# Patient Record
Sex: Female | Born: 1990 | Race: Black or African American | Hispanic: No | Marital: Single | Smoking: Never smoker
Health system: Southern US, Community
[De-identification: ages and names within clinical notes are randomized; demographics above are authoritative.]

## PROBLEM LIST (undated history)

## (undated) DIAGNOSIS — T7840XA Allergy, unspecified, initial encounter: Secondary | ICD-10-CM

## (undated) HISTORY — DX: Allergy, unspecified, initial encounter: T78.40XA

---
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
## 2011-01-15 ENCOUNTER — Telehealth (INDEPENDENT_AMBULATORY_CARE_PROVIDER_SITE_OTHER): Payer: Self-pay | Admitting: *Deleted

## 2011-01-25 NOTE — Progress Notes (Signed)
Summary: Sprintec refill  Phone Note Refill Request Message from:  Fax from Pharmacy on January 15, 2011 11:10 AM  Refills Requested: Medication #1:  SPRINTEC 28 0.25-35 MG-MCG TABS take 1 tab daily as directed   Notes: qty = [address] [facility],  [address] [place], [place], [place]  phone - [phone], fax - [phone]      Next Appointment Scheduled: none Initial call taken by: Jerolyn Shin,  January 15, 2011 11:16 AM    Prescriptions: SPRINTEC 28 0.25-35 MG-MCG TABS (NORGESTIMATE-ETH ESTRADIOL) take 1 tab daily as directed  #1 pack x 3   Entered by:   Jeremy Johann CMA   Authorized by:   Neena Rhymes MD   Signed by:   Jeremy Johann CMA on 01/15/2011   Method used:   Printed then faxed to ...         RxID:   [id number]

## 2011-04-06 ENCOUNTER — Other Ambulatory Visit: Payer: Self-pay | Admitting: Family Medicine

## 2011-04-06 NOTE — Telephone Encounter (Signed)
Sent one refill, pt due for CPX. 

## 2011-05-08 ENCOUNTER — Encounter: Payer: Self-pay | Admitting: Family Medicine

## 2011-05-09 ENCOUNTER — Encounter: Payer: Self-pay | Admitting: Family Medicine

## 2011-05-09 ENCOUNTER — Ambulatory Visit (INDEPENDENT_AMBULATORY_CARE_PROVIDER_SITE_OTHER): Payer: BC Managed Care – PPO | Admitting: Family Medicine

## 2011-05-09 DIAGNOSIS — Z0000 Encounter for general adult medical examination without abnormal findings: Secondary | ICD-10-CM

## 2011-05-09 MED ORDER — NORGESTIMATE-ETH ESTRADIOL 0.25-35 MG-MCG PO TABS: 1.0000 | ORAL_TABLET | Freq: Every day | ORAL | Status: DC

## 2011-05-09 NOTE — Progress Notes (Signed)
  Subjective:    Patient ID: Wendy Hutchinson, female    DOB: 01-14-1991, 20 y.o.   MRN: [id number]  HPI CPE- no concerns today, became sexually active at age 18- no need for pap until age 36.   Review of Systems Patient reports no vision/ hearing changes, adenopathy,fever, weight change,  persistant/recurrent hoarseness , swallowing issues, chest pain, palpitations, edema, persistant/recurrent cough, hemoptysis, dyspnea (rest/exertional/paroxysmal nocturnal), gastrointestinal bleeding (melena, rectal bleeding), abdominal pain, significant heartburn, bowel changes, GU symptoms (dysuria, hematuria, incontinence), Gyn symptoms (abnormal  bleeding, pain),  syncope, focal weakness, memory loss, numbness & tingling, skin/hair/nail changes, abnormal bruising or bleeding, anxiety, or depression.     Objective:   Physical Exam  General Appearance:    Alert, cooperative, no distress, appears stated age  Head:    Normocephalic, without obvious abnormality, atraumatic  Eyes:    PERRL, conjunctiva/corneas clear, EOM's intact, fundi    benign, both eyes  Ears:    Normal TM's and external ear canals, both ears  Nose:   Nares normal, septum midline, mucosa normal, no drainage    or sinus tenderness  Throat:   Lips, mucosa, and tongue normal; teeth and gums normal  Neck:   Supple, symmetrical, trachea midline, no adenopathy;    Thyroid: no enlargement/tenderness/nodules  Back:     Symmetric, no curvature, ROM normal, no CVA tenderness  Lungs:     Clear to auscultation bilaterally, respirations unlabored  Chest Wall:    No tenderness or deformity   Heart:    Regular rate and rhythm, S1 and S2 normal, no murmur, rub   or gallop  Breast Exam:    No tenderness, masses, or nipple abnormality  Abdomen:     Soft, non-tender, bowel sounds active all four quadrants,    no masses, no organomegaly  Genitalia:    Deferred  Rectal:    Deferred  Extremities:   Extremities normal, atraumatic, no cyanosis or edema   Pulses:   2+ and symmetric all extremities  Skin:   Skin color, texture, turgor normal, no rashes or lesions  Lymph nodes:   Cervical, supraclavicular, and axillary nodes normal  Neurologic:   CNII-XII intact, normal strength, sensation and reflexes    throughout          Assessment & Plan:

## 2011-05-09 NOTE — Patient Instructions (Signed)
Your exam looks great!  Keep up the good work! Call if you're having trouble getting your pills Have a great summer!

## 2011-05-13 ENCOUNTER — Encounter: Payer: Self-pay | Admitting: Family Medicine

## 2011-05-13 DIAGNOSIS — Z0000 Encounter for general adult medical examination without abnormal findings: Secondary | ICD-10-CM | POA: Insufficient documentation

## 2011-05-13 NOTE — Assessment & Plan Note (Signed)
Pt's PE WNL.  Refill given on OCPs.  Will need pap at age 20, sooner if she has concerns.  Anticipatory guidance provided.

## 2011-12-10 IMAGING — CR DG SACRUM/COCCYX 2+V
3 series · 3 of 3 positions shown · non-contrast
Comparison: Lumbar spine radiographs 10/11/2010

CLINICAL DATA: Coccygeal pain for 1 week.  No known injury.

SACRUM AND COCCYX - 2+ VIEW

[t sacrum a.p.]
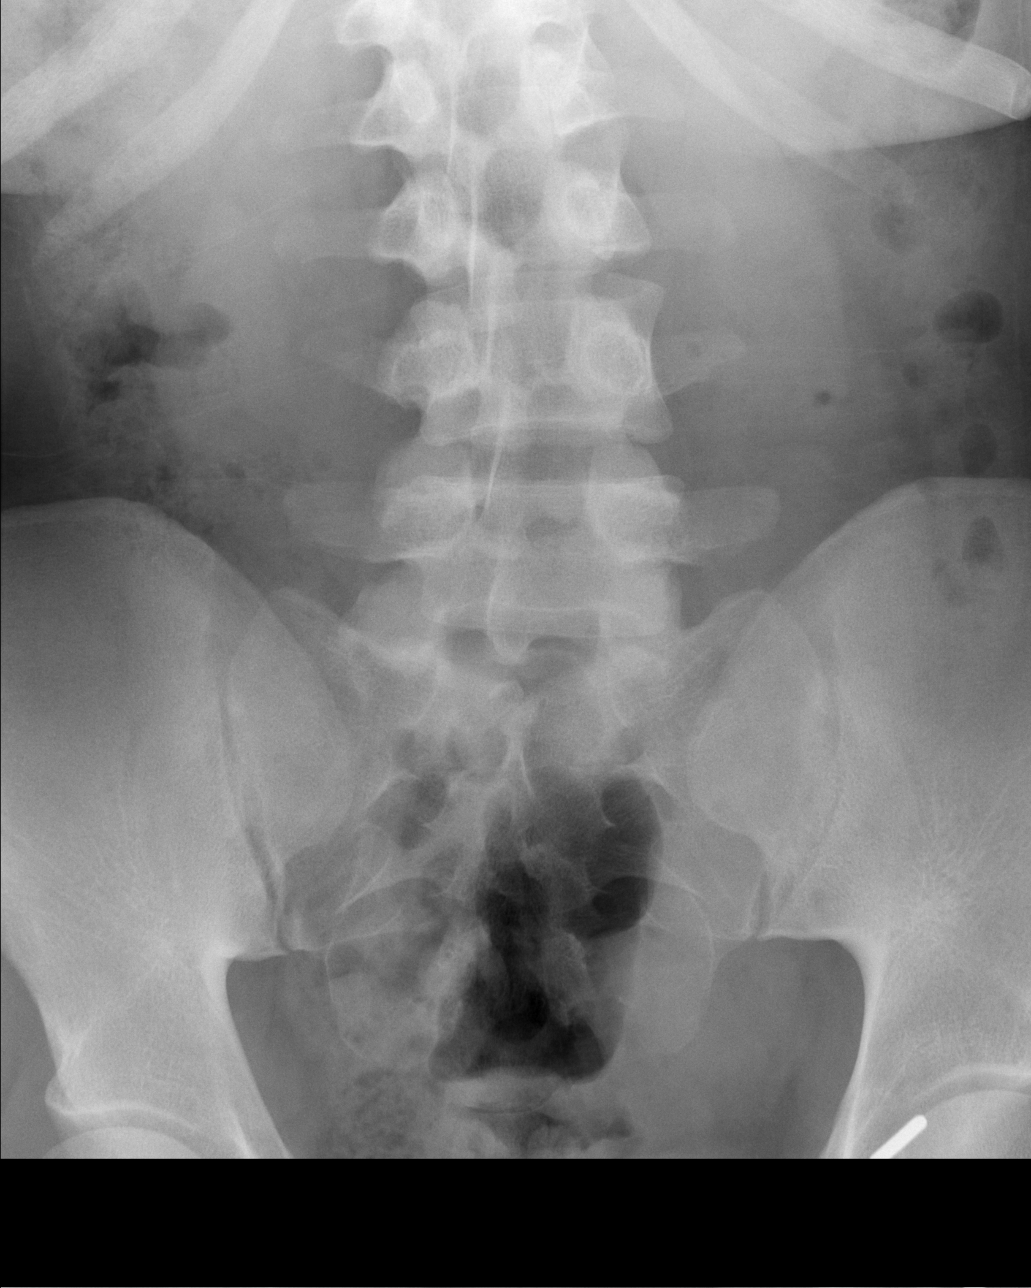

[t coccyx a.p.]
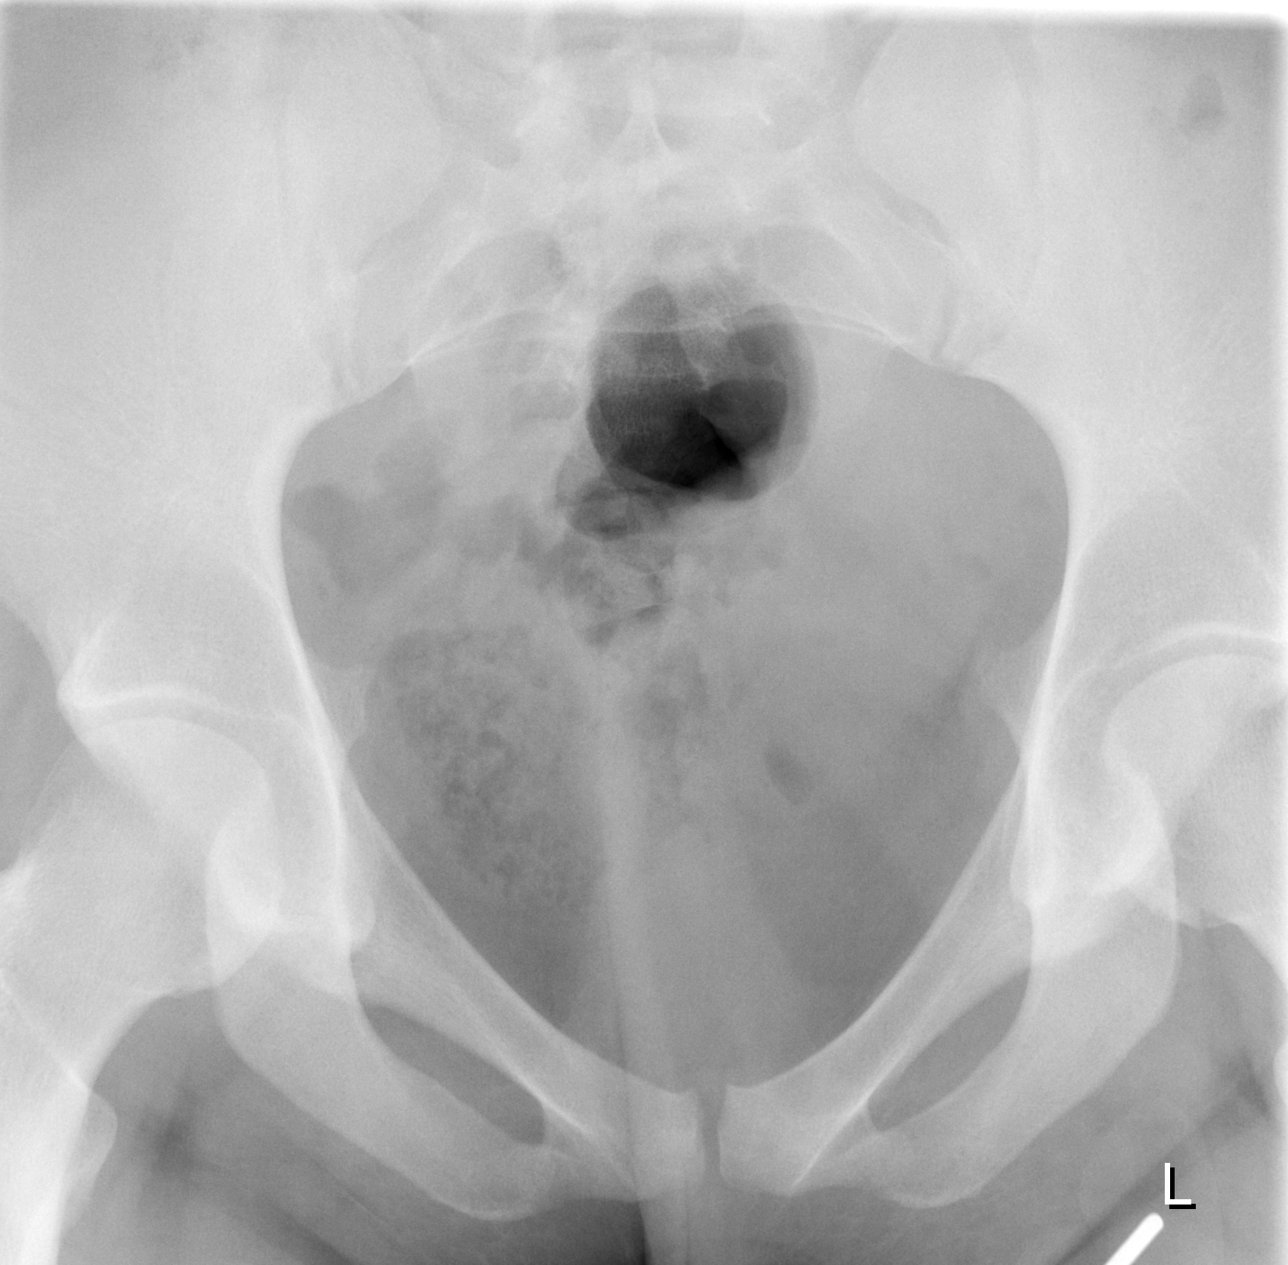

[t sacrum lat]
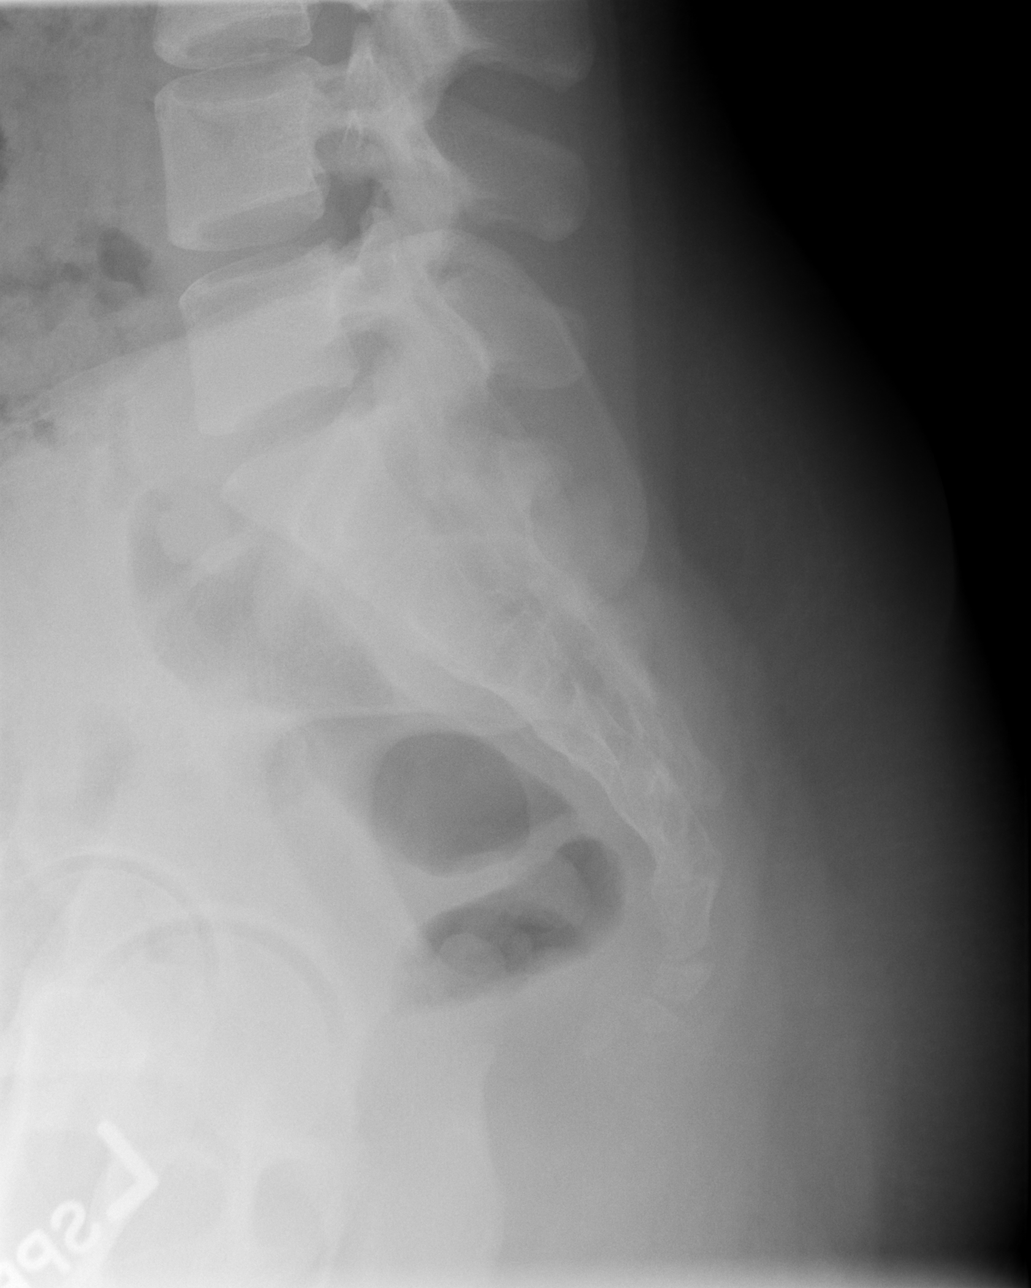

[3 of 3 positions shown; findings below may reference images not displayed]

FINDINGS: Bowel gas projects over the sacrum.  No acute abnormality
of the sacrum or coccyx is identified.  The pubic bones are intact
and the pubic symphysis is unremarkable.  Sacroiliac joints appear
symmetric and patent.
IMPRESSION: No acute bony abnormality.

## 2011-12-21 ENCOUNTER — Ambulatory Visit: Payer: BC Managed Care – PPO | Admitting: Family Medicine

## 2011-12-21 ENCOUNTER — Other Ambulatory Visit: Payer: Self-pay | Admitting: Family Medicine

## 2011-12-21 DIAGNOSIS — N63 Unspecified lump in unspecified breast: Secondary | ICD-10-CM

## 2011-12-28 ENCOUNTER — Other Ambulatory Visit: Payer: Self-pay | Admitting: Family Medicine

## 2011-12-28 ENCOUNTER — Ambulatory Visit (INDEPENDENT_AMBULATORY_CARE_PROVIDER_SITE_OTHER): Payer: BC Managed Care – PPO | Admitting: Family Medicine

## 2011-12-28 ENCOUNTER — Ambulatory Visit
Admission: RE | Admit: 2011-12-28 | Discharge: 2011-12-28 | Disposition: A | Discharge status: Home / Self Care | Payer: BC Managed Care – PPO | Source: Ambulatory Visit | Attending: Family Medicine | Admitting: Family Medicine

## 2011-12-28 ENCOUNTER — Encounter: Payer: Self-pay | Admitting: Family Medicine

## 2011-12-28 DIAGNOSIS — R928 Other abnormal and inconclusive findings on diagnostic imaging of breast: Secondary | ICD-10-CM

## 2011-12-28 DIAGNOSIS — N63 Unspecified lump in unspecified breast: Secondary | ICD-10-CM

## 2011-12-28 DIAGNOSIS — K649 Unspecified hemorrhoids: Secondary | ICD-10-CM

## 2011-12-28 NOTE — Progress Notes (Signed)
  Subjective:    Patient ID: Wendy Hutchinson, female    DOB: 22-Feb-1991, 21 y.o.   MRN: [id number]  HPI Hemorrhoid- dx'd in December at [facility].  Was given a cream to apply.  Not currently having pain, bleeding, itching.  Having regular BMs, no constipation.   Review of Systems For ROS see HPI     Objective:   Physical Exam  Vitals reviewed. Constitutional: She appears well-developed and well-nourished. No distress.  Genitourinary: Rectum normal. Rectal exam shows no external hemorrhoid, no internal hemorrhoid, no fissure and no mass.          Assessment & Plan:

## 2011-12-28 NOTE — Assessment & Plan Note (Signed)
No current hemorrhoid.  Reviewed dx w/ pt and the possibility of recurrence.  Reviewed supportive care and red flags that should prompt return.  Pt expressed understanding and is in agreement w/ plan.

## 2011-12-28 NOTE — Patient Instructions (Signed)
Schedule your complete physical for this summer There is no current hemorrhoid so there is no need to continue to use the cream Call with any questions or concerns!  Hemorrhoids Hemorrhoids are enlarged (dilated) veins around the rectum. There are 2 types of hemorrhoids, and the type of hemorrhoid is determined by its location. Internal hemorrhoids occur in the veins just inside the rectum.They are usually not painful, but they may bleed.However, they may poke through to the outside and become irritated and painful. External hemorrhoids involve the veins outside the anus and can be felt as a painful swelling or hard lump near the anus.They are often itchy and may crack and bleed. Sometimes clots will form in the veins. This makes them swollen and painful. These are called thrombosed hemorrhoids. CAUSES Causes of hemorrhoids include:  Pregnancy. This increases the pressure in the hemorrhoidal veins.   Constipation.   Straining to have a bowel movement.   Obesity.   Heavy lifting or other activity that caused you to strain.  TREATMENT Most of the time hemorrhoids improve in 1 to 2 weeks. However, if symptoms do not seem to be getting better or if you have a lot of rectal bleeding, your caregiver may perform a procedure to help make the hemorrhoids get smaller or remove them completely.Possible treatments include:  Rubber band ligation. A rubber band is placed at the base of the hemorrhoid to cut off the circulation.   Sclerotherapy. A chemical is injected to shrink the hemorrhoid.   Infrared light therapy. Tools are used to burn the hemorrhoid.   Hemorrhoidectomy. This is surgical removal of the hemorrhoid.  HOME CARE INSTRUCTIONS   Increase fiber in your diet. Ask your caregiver about using fiber supplements.   Drink enough water and fluids to keep your urine clear or pale yellow.   Exercise regularly.   Go to the bathroom when you have the urge to have a bowel movement. Do  not wait.   Avoid straining to have bowel movements.   Keep the anal area dry and clean.   Only take over-the-counter or prescription medicines for pain, discomfort, or fever as directed by your caregiver.  If your hemorrhoids are thrombosed:  Take warm sitz baths for 20 to 30 minutes, 3 to 4 times per day.   If the hemorrhoids are very tender and swollen, place ice packs on the area as tolerated. Using ice packs between sitz baths may be helpful. Fill a plastic bag with ice. Place a towel between the bag of ice and your skin.   Medicated creams and suppositories may be used or applied as directed.   Do not use a donut-shaped pillow or sit on the toilet for long periods. This increases blood pooling and pain.  SEEK MEDICAL CARE IF:   You have increasing pain and swelling that is not controlled with your medicine.   You have uncontrolled bleeding.   You have difficulty or you are unable to have a bowel movement.   You have pain or inflammation outside the area of the hemorrhoids.   You have chills or an oral temperature above 102 F (38.9 C).  MAKE SURE YOU:   Understand these instructions.   Will watch your condition.   Will get help right away if you are not doing well or get worse.  Document Released: 11/02/2000 Document Revised: 07/18/2011 Document Reviewed: 03/09/2008 [facility] Patient Information 2012 [place], [place].

## 2012-01-04 ENCOUNTER — Ambulatory Visit
Admission: RE | Admit: 2012-01-04 | Discharge: 2012-01-04 | Disposition: A | Discharge status: Home / Self Care | Payer: BC Managed Care – PPO | Source: Ambulatory Visit | Attending: Family Medicine | Admitting: Family Medicine

## 2012-01-04 DIAGNOSIS — R928 Other abnormal and inconclusive findings on diagnostic imaging of breast: Secondary | ICD-10-CM

## 2012-04-07 ENCOUNTER — Telehealth: Payer: Self-pay | Admitting: Family Medicine

## 2012-04-07 NOTE — Telephone Encounter (Signed)
Refill: Sprintec 28 tab 28 day. Qty 28. Take 1 tablet by mouth every day. Last fill 02-08-12

## 2012-04-08 MED ORDER — NORGESTIMATE-ETH ESTRADIOL 0.25-35 MG-MCG PO TABS: 1.0000 | ORAL_TABLET | Freq: Every day | ORAL | Status: DC

## 2012-04-08 NOTE — Telephone Encounter (Signed)
rx sent to pharmacy by e-script for 2 months per noted pt has upcoming CPE apt 7-13

## 2012-05-29 ENCOUNTER — Encounter: Payer: Self-pay | Admitting: Family Medicine

## 2012-05-29 ENCOUNTER — Ambulatory Visit (INDEPENDENT_AMBULATORY_CARE_PROVIDER_SITE_OTHER): Payer: BC Managed Care – PPO | Admitting: Family Medicine

## 2012-05-29 VITALS — BP 121/78 | HR 73 | Temp 98.2°F | Ht 67.0 in | Wt 184.0 lb

## 2012-05-29 DIAGNOSIS — Z0000 Encounter for general adult medical examination without abnormal findings: Secondary | ICD-10-CM

## 2012-05-29 MED ORDER — NORGESTIMATE-ETH ESTRADIOL 0.25-35 MG-MCG PO TABS: 1.0000 | ORAL_TABLET | Freq: Every day | ORAL | Status: DC

## 2012-05-29 NOTE — Progress Notes (Signed)
  Subjective:    Patient ID: Wendy Hutchinson, female    DOB: 1991/05/13, 21 y.o.   MRN: [id number]  HPI CPE- no concerns.  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     Review of Systems Patient reports no vision/ hearing changes, adenopathy,fever, weight change,  persistant/recurrent hoarseness , swallowing issues, chest pain, palpitations, edema, persistant/recurrent cough, hemoptysis, dyspnea (rest/exertional/paroxysmal nocturnal), gastrointestinal bleeding (melena, rectal bleeding), abdominal pain, significant heartburn, bowel changes, GU symptoms (dysuria, hematuria, incontinence), Gyn symptoms (abnormal  bleeding, pain),  syncope, focal weakness, memory loss, numbness & tingling, skin/hair/nail changes, abnormal bruising or bleeding, anxiety, or depression.     Objective:   Physical Exam General Appearance:    Alert, cooperative, no distress, appears stated age  Head:    Normocephalic, without obvious abnormality, atraumatic  Eyes:    PERRL, conjunctiva/corneas clear, EOM's intact, fundi    benign, both eyes  Ears:    Normal TM's and external ear canals, both ears  Nose:   Nares normal, septum midline, mucosa normal, no drainage    or sinus tenderness  Throat:   Lips, mucosa, and tongue normal; teeth and gums normal  Neck:   Supple, symmetrical, trachea midline, no adenopathy;    Thyroid: no enlargement/tenderness/nodules  Back:     Symmetric, no curvature, ROM normal, no CVA tenderness  Lungs:     Clear to auscultation bilaterally, respirations unlabored  Chest Wall:    No tenderness or deformity   Heart:    Regular rate and rhythm, S1 and S2 normal, no murmur, rub   or gallop  Breast Exam:    Deferred to GYN  Abdomen:     Soft, non-tender, bowel sounds active all four quadrants,    no masses, no organomegaly  Genitalia:    Deferred to GYN  Rectal:    Extremities:   Extremities normal, atraumatic, no cyanosis or edema  Pulses:   2+ and symmetric all extremities  Skin:   Skin  color, texture, turgor normal, no rashes or lesions  Lymph nodes:   Cervical, supraclavicular, and axillary nodes normal  Neurologic:   CNII-XII intact, normal strength, sensation and reflexes    throughout          Assessment & Plan:

## 2012-05-29 NOTE — Patient Instructions (Addendum)
Schedule your physical for next summer when you're home Keep up the good work!  You look great! We'll notify you of your lab results Call with any questions or concerns Have a great summer! Daisey Must w/ school!!!

## 2012-05-30 ENCOUNTER — Encounter: Payer: Self-pay | Admitting: *Deleted

## 2012-05-30 LAB — BASIC METABOLIC PANEL
CO2: 27 mEq/L (ref 19–32)
Calcium: 9.2 mg/dL (ref 8.4–10.5)
Creatinine, Ser: 0.7 mg/dL (ref 0.4–1.2)
GFR: 142.23 mL/min (ref >60.00)
Glucose, Bld: 85 mg/dL (ref 70–99)

## 2012-05-30 LAB — LIPID PANEL
Cholesterol: 163 mg/dL (ref 0–200)
Triglycerides: 48 mg/dL (ref 0.0–149.0)

## 2012-05-30 LAB — CBC WITH DIFFERENTIAL/PLATELET
Eosinophils Relative: 1.6 % (ref 0.0–5.0)
HCT: 34.7 % — ABNORMAL LOW (ref 36.0–46.0)
Hemoglobin: 11.6 g/dL — ABNORMAL LOW (ref 12.0–15.0)
Lymphs Abs: 2.2 10*3/uL (ref 0.7–4.0)
MCV: 86 fl (ref 78.0–100.0)
Monocytes Absolute: 0.4 10*3/uL (ref 0.1–1.0)
Monocytes Relative: 9.3 % (ref 3.0–12.0)
Neutro Abs: 1.6 10*3/uL (ref 1.4–7.7)
RDW: 13.7 % (ref 11.5–14.6)

## 2012-05-30 LAB — TSH: TSH: 0.83 u[IU]/mL (ref 0.35–5.50)

## 2012-05-30 LAB — HEPATIC FUNCTION PANEL
Albumin: 3.7 g/dL (ref 3.5–5.2)
Total Protein: 7.2 g/dL (ref 6.0–8.3)

## 2012-06-02 LAB — VITAMIN D 1,25 DIHYDROXY: Vitamin D2 1, 25 (OH)2: <8 pg/mL

## 2012-06-03 ENCOUNTER — Encounter: Payer: Self-pay | Admitting: *Deleted

## 2012-06-03 NOTE — Assessment & Plan Note (Signed)
Pt's PE WNL.  UTD on immunizations.  Check labs as baseline.  Anticipatory guidance provided.

## 2013-05-30 ENCOUNTER — Ambulatory Visit (INDEPENDENT_AMBULATORY_CARE_PROVIDER_SITE_OTHER): Payer: BC Managed Care – PPO | Admitting: Family Medicine

## 2013-05-30 VITALS — BP 108/66 | HR 86 | Temp 98.0°F | Resp 16 | Ht 66.5 in | Wt 191.8 lb

## 2013-05-30 DIAGNOSIS — L0390 Cellulitis, unspecified: Secondary | ICD-10-CM

## 2013-05-30 DIAGNOSIS — M79609 Pain in unspecified limb: Secondary | ICD-10-CM

## 2013-05-30 DIAGNOSIS — L0291 Cutaneous abscess, unspecified: Secondary | ICD-10-CM

## 2013-05-30 MED ORDER — DOXYCYCLINE HYCLATE 100 MG PO CAPS: 100.0000 mg | ORAL_CAPSULE | Freq: Two times a day (BID) | ORAL | Status: DC

## 2013-05-30 NOTE — Progress Notes (Signed)
Procedure Note: Verbal consent obtained.  Local anesthesia with 2 cc 2% lidocaine.  Betadine prep.  Incision with 11 blade.  Copious purulence and sebaceous material expressed.  Wound explored.  Attempted to remove as much as the cyst wall as possible Wound irrigated with remaining anesthetic.  Packed with 1/4 inch plain packing.  Cleansed and dressed.  Discussed wound care.  Pt tolerated very well.

## 2013-05-30 NOTE — Progress Notes (Signed)
  Subjective:    Patient ID: Wendy Hutchinson, female    DOB: September 30, 1991, 21 y.o.   MRN: [id number] Chief Complaint  Patient presents with  . Recurrent Skin Infections    axilla-6 days    HPI  For the past 2 wks has had intermittant fluctuance of nodule in left axilla.  This week has become progressively enlarge and painful.  Has not drained anything. Has not tried any treatment at home.  Did have a cyst once that required an I&D but no h/o prior abscesses.  Past Medical History  Diagnosis Date  . Allergy    No current outpatient prescriptions on file prior to visit.   No current facility-administered medications on file prior to visit.   No Known Allergies   Review of Systems  Constitutional: Negative for fever, chills, diaphoresis and activity change.  Musculoskeletal: Negative for joint swelling and gait problem.  Skin: Negative for color change, pallor, rash and wound.  Hematological: Negative for adenopathy. Does not bruise/bleed easily.  Psychiatric/Behavioral: The patient is nervous/anxious.       BP 108/66  Pulse 86  Temp(Src) 98 F (36.7 C) (Oral)  Resp 16  Ht 5' 6.5" (1.689 m)  Wt 191 lb 12.8 oz (87 kg)  BMI 30.5 kg/m2  SpO2 100%  LMP 05/11/2013 Objective:   Physical Exam  Constitutional: Wendy Hutchinson is oriented to person, place, and time. Wendy Hutchinson appears well-developed and well-nourished. No distress.  HENT:  Head: Normocephalic and atraumatic.  Eyes: Conjunctivae are normal. No scleral icterus.  Pulmonary/Chest: Effort normal.  Musculoskeletal: Wendy Hutchinson exhibits no edema.  Neurological: Wendy Hutchinson is alert and oriented to person, place, and time.  Skin: Skin is warm and dry. Lesion noted. Wendy Hutchinson is not diaphoretic.  Large 2 in x 1 in fluctuant subcu mass in anterior inferior left axilla, tender with dark central pinpoint head. No warmth or erythema  Psychiatric: Wendy Hutchinson has a normal mood and affect. Her behavior is normal.      Assessment & Plan:  Cellulitis and abscess - Plan:  Wound culture - s/p I&D by PA - see note. Pt will f/u in [place] where Wendy Hutchinson lives at an UC in 2d as Wendy Hutchinson is leaving town for [employer].  Could be infected sebaceous cyst, rec freq warm compresses and doxy.  Meds ordered this encounter  Medications  . doxycycline (VIBRAMYCIN) 100 MG capsule    Sig: Take 1 capsule (100 mg total) by mouth 2 (two) times daily.    Dispense:  20 capsule    Refill:  0

## 2013-05-30 NOTE — Patient Instructions (Addendum)
Begin taking the antibiotics today.  Frequent warm compresses to the area tonight and tomorrow.  Change the dressing at least 2 times daily.  Follow up with your regular doctor in [place] in 2 days.     Abscess An abscess is an infected area that contains a collection of pus and debris.It can occur in almost any part of the body. An abscess is also known as a furuncle or boil. CAUSES  An abscess occurs when tissue gets infected. This can occur from blockage of oil or sweat glands, infection of hair follicles, or a minor injury to the skin. As the body tries to fight the infection, pus collects in the area and creates pressure under the skin. This pressure causes pain. People with weakened immune systems have difficulty fighting infections and get certain abscesses more often.  SYMPTOMS Usually an abscess develops on the skin and becomes a painful mass that is red, warm, and tender. If the abscess forms under the skin, you may feel a moveable soft area under the skin. Some abscesses break open (rupture) on their own, but most will continue to get worse without care. The infection can spread deeper into the body and eventually into the bloodstream, causing you to feel ill.  DIAGNOSIS  Your caregiver will take your medical history and perform a physical exam. A sample of fluid may also be taken from the abscess to determine what is causing your infection. TREATMENT  Your caregiver may prescribe antibiotic medicines to fight the infection. However, taking antibiotics alone usually does not cure an abscess. Your caregiver may need to make a small cut (incision) in the abscess to drain the pus. In some cases, gauze is packed into the abscess to reduce pain and to continue draining the area. HOME CARE INSTRUCTIONS   Only take over-the-counter or prescription medicines for pain, discomfort, or fever as directed by your caregiver.  If you were prescribed antibiotics, take them as directed. Finish them  even if you start to feel better.  If gauze is used, follow your caregiver's directions for changing the gauze.  To avoid spreading the infection:  Keep your draining abscess covered with a bandage.  Wash your hands well.  Do not share personal care items, towels, or whirlpools with others.  Avoid skin contact with others.  Keep your skin and clothes clean around the abscess.  Keep all follow-up appointments as directed by your caregiver. SEEK MEDICAL CARE IF:   You have increased pain, swelling, redness, fluid drainage, or bleeding.  You have muscle aches, chills, or a general ill feeling.  You have a fever. MAKE SURE YOU:   Understand these instructions.  Will watch your condition.  Will get help right away if you are not doing well or get worse. Document Released: 08/15/2005 Document Revised: 05/06/2012 Document Reviewed: 01/18/2012 [facility] Patient Information 2014 [place], [place].

## 2013-06-01 ENCOUNTER — Telehealth: Payer: Self-pay | Admitting: *Deleted

## 2013-06-01 LAB — WOUND CULTURE
Gram Stain: NONE SEEN
Gram Stain: NONE SEEN

## 2013-06-01 MED ORDER — NORGESTIMATE-ETH ESTRADIOL 0.25-35 MG-MCG PO TABS: 1.0000 | ORAL_TABLET | Freq: Every day | ORAL | Status: DC

## 2013-06-01 NOTE — Telephone Encounter (Signed)
Rx sent 

## 2013-06-22 ENCOUNTER — Encounter: Payer: Self-pay | Admitting: Family Medicine

## 2013-06-22 ENCOUNTER — Ambulatory Visit (INDEPENDENT_AMBULATORY_CARE_PROVIDER_SITE_OTHER): Payer: BC Managed Care – PPO | Admitting: Family Medicine

## 2013-06-22 VITALS — BP 102/60 | HR 72 | Temp 98.1°F | Ht 65.75 in | Wt 197.0 lb

## 2013-06-22 DIAGNOSIS — Z1331 Encounter for screening for depression: Secondary | ICD-10-CM

## 2013-06-22 DIAGNOSIS — Z0000 Encounter for general adult medical examination without abnormal findings: Secondary | ICD-10-CM

## 2013-06-22 MED ORDER — NORGESTIMATE-ETH ESTRADIOL 0.25-35 MG-MCG PO TABS: 1.0000 | ORAL_TABLET | Freq: Every day | ORAL | Status: DC

## 2013-06-22 NOTE — Assessment & Plan Note (Signed)
Pt's PE WNL.  Check labs.  Anticipatory guidance provided.  Pt will need pap starting next year.

## 2013-06-22 NOTE — Patient Instructions (Signed)
Follow up in 1 year or as needed Keep up the good work!  You look great! Call with any questions or concerns Good Luck at School!!! 

## 2013-06-22 NOTE — Progress Notes (Signed)
  Subjective:    Patient ID: Wendy Hutchinson, female    DOB: [birth year], 22 y.o.   MRN: [id number]  HPI CPE- is sexually active, using condoms.  On OCPs.  No concerns for sexually transmitted infections.   Review of Systems Patient reports no vision/ hearing changes, adenopathy,fever, weight change,  persistant/recurrent hoarseness , swallowing issues, chest pain, palpitations, edema, persistant/recurrent cough, hemoptysis, dyspnea (rest/exertional/paroxysmal nocturnal), gastrointestinal bleeding (melena, rectal bleeding), abdominal pain, significant heartburn, bowel changes, GU symptoms (dysuria, hematuria, incontinence), Gyn symptoms (abnormal  bleeding, pain),  syncope, focal weakness, memory loss, numbness & tingling, skin/hair/nail changes, abnormal bruising or bleeding, anxiety, or depression.     Objective:   Physical Exam General Appearance:    Alert, cooperative, no distress, appears stated age  Head:    Normocephalic, without obvious abnormality, atraumatic  Eyes:    PERRL, conjunctiva/corneas clear, EOM's intact, fundi    benign, both eyes  Ears:    Normal TM's and external ear canals, both ears  Nose:   Nares normal, septum midline, mucosa normal, no drainage    or sinus tenderness  Throat:   Lips, mucosa, and tongue normal; teeth and gums normal  Neck:   Supple, symmetrical, trachea midline, no adenopathy;    Thyroid: no enlargement/tenderness/nodules  Back:     Symmetric, no curvature, ROM normal, no CVA tenderness  Lungs:     Clear to auscultation bilaterally, respirations unlabored  Chest Wall:    No tenderness or deformity   Heart:    Regular rate and rhythm, S1 and S2 normal, no murmur, rub   or gallop  Breast Exam:    Deferred  Abdomen:     Soft, non-tender, bowel sounds active all four quadrants,    no masses, no organomegaly  Genitalia:    Deferred   Rectal:    Extremities:   Extremities normal, atraumatic, no cyanosis or edema  Pulses:   2+ and symmetric all  extremities  Skin:   Skin color, texture, turgor normal, no rashes or lesions  Lymph nodes:   Cervical, supraclavicular, and axillary nodes normal  Neurologic:   CNII-XII intact, normal strength, sensation and reflexes    throughout          Assessment & Plan:

## 2014-03-02 ENCOUNTER — Encounter: Payer: Self-pay | Admitting: General Practice

## 2014-03-02 NOTE — Progress Notes (Signed)
Letter mailed to pt to notify that labs were not performed at last visit as ordered. Pt advised to contact office to schedule appt.  

## 2014-05-26 ENCOUNTER — Telehealth: Payer: Self-pay | Admitting: *Deleted

## 2014-05-26 NOTE — Telephone Encounter (Signed)
Caller name:  Lester Relation to pt:  self Call back number: [phone]  Pharmacy:  Reason for call:   Pt returned call, stating she received a VM that said more information was needed to complete the immunization request.  She would also like a copy of her most recent physical.  She can be reached at the number above.

## 2014-05-27 ENCOUNTER — Other Ambulatory Visit: Payer: Self-pay | Admitting: Family Medicine

## 2014-05-27 DIAGNOSIS — Z2839 Other underimmunization status: Secondary | ICD-10-CM

## 2014-05-27 DIAGNOSIS — Z9189 Other specified personal risk factors, not elsewhere classified: Principal | ICD-10-CM

## 2014-05-27 NOTE — Telephone Encounter (Signed)
Pt needs only a varicella titer, called and lmovm to inform need to schedule a lab appt. Orders are already placed.

## 2014-05-28 ENCOUNTER — Other Ambulatory Visit: Payer: BC Managed Care – PPO

## 2014-05-28 DIAGNOSIS — Z2839 Other underimmunization status: Secondary | ICD-10-CM

## 2014-05-28 DIAGNOSIS — Z9189 Other specified personal risk factors, not elsewhere classified: Principal | ICD-10-CM

## 2014-06-01 ENCOUNTER — Telehealth: Payer: Self-pay | Admitting: *Deleted

## 2014-06-01 NOTE — Telephone Encounter (Signed)
Caller name: Emmalea Relation to pt:  self Call back number: [phone]  Pharmacy:  Reason for call: Pt called to find out if we had results of Varicella Zoster Abs, IgG/IgM collected 05/28/14.  Please advise pt at above number.  bw

## 2014-06-01 NOTE — Telephone Encounter (Signed)
Pt notified this can take 1-2 weeks to come back.

## 2014-06-04 NOTE — Telephone Encounter (Signed)
Pt is immune to chicken pox, labs sent to scanning and a copy was mailed to pt.

## 2014-07-02 ENCOUNTER — Other Ambulatory Visit: Payer: Self-pay | Admitting: Family Medicine

## 2014-07-02 NOTE — Telephone Encounter (Signed)
Med filled.  

## 2014-11-22 ENCOUNTER — Ambulatory Visit (INDEPENDENT_AMBULATORY_CARE_PROVIDER_SITE_OTHER): Payer: BC Managed Care – PPO | Admitting: Family Medicine

## 2014-11-22 ENCOUNTER — Encounter: Payer: Self-pay | Admitting: Family Medicine

## 2014-11-22 VITALS — BP 110/72 | HR 98 | Temp 98.9°F | Resp 16 | Wt 231.4 lb

## 2014-11-22 DIAGNOSIS — Z309 Encounter for contraceptive management, unspecified: Secondary | ICD-10-CM | POA: Insufficient documentation

## 2014-11-22 DIAGNOSIS — Z3041 Encounter for surveillance of contraceptive pills: Secondary | ICD-10-CM

## 2014-11-22 MED ORDER — NORGESTIMATE-ETH ESTRADIOL 0.25-35 MG-MCG PO TABS: 1.0000 | ORAL_TABLET | Freq: Every day | ORAL | Status: DC

## 2014-11-22 NOTE — Progress Notes (Signed)
Pre visit review using our clinic review tool, if applicable. No additional management support is needed unless otherwise documented below in the visit note. 

## 2014-11-22 NOTE — Progress Notes (Signed)
   Subjective:    Patient ID: Wendy Hutchinson, female    DOB: Jun 02, 1991, 24 y.o.   MRN: [id number]  HPI Contraception- pt was previously on Sprintec and doing well.  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  Restarted pills this month- ~1 week ago.  No chance of pregnancy.     Review of Systems For ROS see HPI     Objective:   Physical Exam  Constitutional: She is oriented to person, place, and time. She appears well-developed and well-nourished. No distress.  HENT:  Head: Normocephalic and atraumatic.  Neurological: She is alert and oriented to person, place, and time.  Skin: Skin is warm and dry.  Psychiatric: She has a normal mood and affect. Her behavior is normal. Thought content normal.  Vitals reviewed.         Assessment & Plan:

## 2014-11-22 NOTE — Assessment & Plan Note (Signed)
Discussed need for pt to take regularly to ensure that pt is appropriately protected from unwanted pregnancy.  Discussed w/ pt that b/c she is now 63, she is due for CPE w/ pap.  Pt in agreement.  Will follow.

## 2014-11-22 NOTE — Patient Instructions (Signed)
Schedule your complete physical at your convenience (this will include a pap) Continue the birth control daily- try and take this around the same time each day It will take 1-2 months of pills before they are reliably effective in preventing pregnancy Call with any questions or concerns Happy New Year! Good luck with school!!!

## 2015-05-30 ENCOUNTER — Encounter: Payer: Self-pay | Admitting: Family Medicine

## 2015-06-22 ENCOUNTER — Ambulatory Visit (INDEPENDENT_AMBULATORY_CARE_PROVIDER_SITE_OTHER): Payer: BLUE CROSS/BLUE SHIELD | Admitting: Family Medicine

## 2015-06-22 ENCOUNTER — Other Ambulatory Visit (HOSPITAL_COMMUNITY)
Admission: RE | Admit: 2015-06-22 | Discharge: 2015-06-22 | Disposition: A | Discharge status: Home / Self Care | Payer: BLUE CROSS/BLUE SHIELD | Source: Ambulatory Visit | Attending: Family Medicine | Admitting: Family Medicine

## 2015-06-22 ENCOUNTER — Telehealth: Payer: Self-pay | Admitting: Family Medicine

## 2015-06-22 ENCOUNTER — Encounter: Payer: Self-pay | Admitting: Family Medicine

## 2015-06-22 VITALS — BP 114/72 | HR 87 | Temp 98.1°F | Resp 16 | Ht 66.0 in | Wt 239.1 lb

## 2015-06-22 DIAGNOSIS — Z124 Encounter for screening for malignant neoplasm of cervix: Secondary | ICD-10-CM

## 2015-06-22 DIAGNOSIS — Z202 Contact with and (suspected) exposure to infections with a predominantly sexual mode of transmission: Secondary | ICD-10-CM | POA: Diagnosis not present

## 2015-06-22 DIAGNOSIS — Z01419 Encounter for gynecological examination (general) (routine) without abnormal findings: Secondary | ICD-10-CM | POA: Insufficient documentation

## 2015-06-22 DIAGNOSIS — Z0000 Encounter for general adult medical examination without abnormal findings: Secondary | ICD-10-CM

## 2015-06-22 DIAGNOSIS — E010 Iodine-deficiency related diffuse (endemic) goiter: Secondary | ICD-10-CM

## 2015-06-22 DIAGNOSIS — E049 Nontoxic goiter, unspecified: Secondary | ICD-10-CM | POA: Diagnosis not present

## 2015-06-22 DIAGNOSIS — Z1151 Encounter for screening for human papillomavirus (HPV): Secondary | ICD-10-CM | POA: Insufficient documentation

## 2015-06-22 LAB — HEPATIC FUNCTION PANEL
ALBUMIN: 4 g/dL (ref 3.5–5.2)
ALK PHOS: 51 U/L (ref 39–117)
ALT: 13 U/L (ref 0–35)
AST: 15 U/L (ref 0–37)
BILIRUBIN TOTAL: 0.5 mg/dL (ref 0.2–1.2)
Bilirubin, Direct: 0.1 mg/dL (ref 0.0–0.3)
Total Protein: 7.2 g/dL (ref 6.0–8.3)

## 2015-06-22 LAB — LIPID PANEL
CHOL/HDL RATIO: 3
CHOLESTEROL: 168 mg/dL (ref 0–200)
HDL: 62.9 mg/dL (ref >39.00)
LDL Cholesterol: 90 mg/dL (ref 0–99)
NONHDL: 104.95
Triglycerides: 76 mg/dL (ref 0.0–149.0)
VLDL: 15.2 mg/dL (ref 0.0–40.0)

## 2015-06-22 LAB — BASIC METABOLIC PANEL
BUN: 10 mg/dL (ref 6–23)
CALCIUM: 9.4 mg/dL (ref 8.4–10.5)
CHLORIDE: 101 meq/L (ref 96–112)
CO2: 29 mEq/L (ref 19–32)
CREATININE: 0.7 mg/dL (ref 0.40–1.20)
GFR: 133.58 mL/min (ref >60.00)
GLUCOSE: 82 mg/dL (ref 70–99)
Potassium: 3.8 mEq/L (ref 3.5–5.1)
SODIUM: 135 meq/L (ref 135–145)

## 2015-06-22 LAB — TSH: TSH: 1.19 u[IU]/mL (ref 0.35–4.50)

## 2015-06-22 LAB — VITAMIN D 25 HYDROXY (VIT D DEFICIENCY, FRACTURES): VITD: 9.55 ng/mL — ABNORMAL LOW (ref 30.00–100.00)

## 2015-06-22 NOTE — Progress Notes (Signed)
   Subjective:    Patient ID: Versie Starks, female    DOB: [birth year], 24 y.o.   MRN: [id number]  HPI CPE- due for pap.  No concerns today.   Review of Systems Patient reports no vision/ hearing changes, adenopathy,fever, weight change,  persistant/recurrent hoarseness , swallowing issues, chest pain, palpitations, edema, persistant/recurrent cough, hemoptysis, dyspnea (rest/exertional/paroxysmal nocturnal), gastrointestinal bleeding (melena, rectal bleeding), abdominal pain, significant heartburn, bowel changes, GU symptoms (dysuria, hematuria, incontinence), Gyn symptoms (abnormal  bleeding, pain),  syncope, focal weakness, memory loss, numbness & tingling, skin/hair/nail changes, abnormal bruising or bleeding, anxiety, or depression.     Objective:   Physical Exam  General Appearance:    Alert, cooperative, no distress, appears stated age, obese  Head:    Normocephalic, without obvious abnormality, atraumatic  Eyes:    PERRL, conjunctiva/corneas clear, EOM's intact, fundi    benign, both eyes  Ears:    Normal TM's and external ear canals, both ears  Nose:   Nares normal, septum midline, mucosa normal, no drainage    or sinus tenderness  Throat:   Lips, mucosa, and tongue normal; teeth and gums normal  Neck:   Supple, symmetrical, trachea midline, no adenopathy;    Thyroid: diffuse enlargement w/ possible nodularity on R  Back:     Symmetric, no curvature, ROM normal, no CVA tenderness  Lungs:     Clear to auscultation bilaterally, respirations unlabored  Chest Wall:    No tenderness or deformity   Heart:    Regular rate and rhythm, S1 and S2 normal, no murmur, rub   or gallop  Breast Exam:    No tenderness, masses, or nipple abnormality  Abdomen:     Soft, non-tender, bowel sounds active all four quadrants,    no masses, no organomegaly  Genitalia:    External genitalia normal, cervix normal in appearance, no CMT, uterus in normal size and position, adnexa w/out mass or  tenderness, mucosa pink and moist, no lesions or discharge present  Rectal:    Normal external appearance  Extremities:   Extremities normal, atraumatic, no cyanosis or edema  Pulses:   2+ and symmetric all extremities  Skin:   Skin color, texture, turgor normal, no rashes or lesions  Lymph nodes:   Cervical, supraclavicular, and axillary nodes normal  Neurologic:   CNII-XII intact, normal strength, sensation and reflexes    throughout          Assessment & Plan:

## 2015-06-22 NOTE — Telephone Encounter (Signed)
Noted  

## 2015-06-22 NOTE — Assessment & Plan Note (Signed)
New.  Diffusely enlarged w/ questionable nodularity on R.  Get [place] to assess.  Pt expressed understanding and is in agreement w/ plan.

## 2015-06-22 NOTE — Assessment & Plan Note (Signed)
Pt's PE WNL w/ exception of obesity and thyromegaly.  Pt had 1st pap done today- she tolerated procedure w/o difficulty.  Check labs.  Stressed need for healthy diet and regular exercise.  Anticipatory guidance provided.

## 2015-06-22 NOTE — Telephone Encounter (Signed)
I was previously advised that pap only appts are 15 min. I scheduled the appt for today 1:15pm. Pt originally scheduled cpe on mychart but it was not an appropriate spot. I called her and rescheduled for first avail (the cpe in for 11/16/15). She stated at that time she really needed to get pap done so I scheduled pap only 15 minute and the physical in December.

## 2015-06-22 NOTE — Patient Instructions (Signed)
Follow up in 1 year or as needed We'll notify you of your lab results and make any changes if needed We'll call you with your thyroid ultrasound Try and make healthy food choices and get regular exercise- this is a great stress outlet! Call with any questions or concerns Keep up the good work! GOOD LUCK with 2nd year!!!

## 2015-06-22 NOTE — Progress Notes (Signed)
Pre visit review using our clinic review tool, if applicable. No additional management support is needed unless otherwise documented below in the visit note. 

## 2015-06-23 ENCOUNTER — Other Ambulatory Visit: Payer: Self-pay | Admitting: General Practice

## 2015-06-23 LAB — RPR

## 2015-06-23 LAB — CBC WITH DIFFERENTIAL/PLATELET
BASOS ABS: 0.1 10*3/uL (ref 0.0–0.1)
Basophils Relative: 0.9 % (ref 0.0–3.0)
EOS ABS: 0.2 10*3/uL (ref 0.0–0.7)
Eosinophils Relative: 2.7 % (ref 0.0–5.0)
HEMATOCRIT: 36.4 % (ref 36.0–46.0)
Hemoglobin: 12.3 g/dL (ref 12.0–15.0)
LYMPHS ABS: 2.8 10*3/uL (ref 0.7–4.0)
Lymphocytes Relative: 49.3 % — ABNORMAL HIGH (ref 12.0–46.0)
MCHC: 33.9 g/dL (ref 30.0–36.0)
MCV: 84.7 fl (ref 78.0–100.0)
MONO ABS: 0.6 10*3/uL (ref 0.1–1.0)
Monocytes Relative: 10.4 % (ref 3.0–12.0)
Neutro Abs: 2.1 10*3/uL (ref 1.4–7.7)
Neutrophils Relative %: 36.7 % — ABNORMAL LOW (ref 43.0–77.0)
Platelets: 261 10*3/uL (ref 150.0–400.0)
RBC: 4.3 Mil/uL (ref 3.87–5.11)
RDW: 14.1 % (ref 11.5–15.5)
WBC: 5.7 10*3/uL (ref 4.0–10.5)

## 2015-06-23 LAB — GC/CHLAMYDIA PROBE AMP, URINE
CHLAMYDIA, SWAB/URINE, PCR: NEGATIVE
GC Probe Amp, Urine: NEGATIVE

## 2015-06-23 LAB — HIV ANTIBODY (ROUTINE TESTING W REFLEX): HIV 1&2 Ab, 4th Generation: NONREACTIVE

## 2015-06-23 MED ORDER — VITAMIN D (ERGOCALCIFEROL) 1.25 MG (50000 UNIT) PO CAPS: 50000.0000 [IU] | ORAL_CAPSULE | ORAL | Status: DC

## 2015-06-24 LAB — CYTOLOGY - PAP

## 2015-06-30 ENCOUNTER — Ambulatory Visit (HOSPITAL_BASED_OUTPATIENT_CLINIC_OR_DEPARTMENT_OTHER)
Admission: RE | Admit: 2015-06-30 | Discharge: 2015-06-30 | Disposition: A | Discharge status: Home / Self Care | Payer: BLUE CROSS/BLUE SHIELD | Source: Ambulatory Visit | Attending: Family Medicine | Admitting: Family Medicine

## 2015-06-30 DIAGNOSIS — E010 Iodine-deficiency related diffuse (endemic) goiter: Secondary | ICD-10-CM | POA: Diagnosis present

## 2015-11-16 ENCOUNTER — Encounter: Payer: BC Managed Care – PPO | Admitting: Family Medicine

## 2015-11-28 ENCOUNTER — Other Ambulatory Visit: Payer: Self-pay | Admitting: Family Medicine

## 2015-11-29 ENCOUNTER — Other Ambulatory Visit: Payer: Self-pay

## 2015-11-29 MED ORDER — VITAMIN D (ERGOCALCIFEROL) 1.25 MG (50000 UNIT) PO CAPS: 50000.0000 [IU] | ORAL_CAPSULE | ORAL | Status: DC

## 2015-11-29 MED ORDER — NORGESTIMATE-ETH ESTRADIOL 0.25-35 MG-MCG PO TABS: 1.0000 | ORAL_TABLET | Freq: Every day | ORAL | Status: DC

## 2016-02-05 ENCOUNTER — Other Ambulatory Visit: Payer: Self-pay | Admitting: Family Medicine

## 2016-02-06 NOTE — Telephone Encounter (Signed)
Medication filled to pharmacy as requested.   

## 2016-07-24 ENCOUNTER — Other Ambulatory Visit: Payer: Self-pay | Admitting: Family Medicine

## 2016-12-14 ENCOUNTER — Ambulatory Visit (INDEPENDENT_AMBULATORY_CARE_PROVIDER_SITE_OTHER): Payer: BLUE CROSS/BLUE SHIELD | Admitting: Family Medicine

## 2016-12-14 ENCOUNTER — Encounter: Payer: Self-pay | Admitting: Family Medicine

## 2016-12-14 ENCOUNTER — Other Ambulatory Visit (HOSPITAL_COMMUNITY)
Admission: RE | Admit: 2016-12-14 | Discharge: 2016-12-14 | Disposition: A | Discharge status: Home / Self Care | Payer: BLUE CROSS/BLUE SHIELD | Source: Ambulatory Visit | Attending: Family Medicine | Admitting: Family Medicine

## 2016-12-14 VITALS — BP 120/77 | HR 74 | Temp 98.0°F | Resp 16 | Ht 66.0 in | Wt 248.0 lb

## 2016-12-14 DIAGNOSIS — E282 Polycystic ovarian syndrome: Secondary | ICD-10-CM | POA: Diagnosis not present

## 2016-12-14 DIAGNOSIS — Z308 Encounter for other contraceptive management: Secondary | ICD-10-CM

## 2016-12-14 DIAGNOSIS — Z202 Contact with and (suspected) exposure to infections with a predominantly sexual mode of transmission: Secondary | ICD-10-CM

## 2016-12-14 DIAGNOSIS — Z113 Encounter for screening for infections with a predominantly sexual mode of transmission: Secondary | ICD-10-CM | POA: Diagnosis present

## 2016-12-14 MED ORDER — NORETHINDRONE ACET-ETHINYL EST 1-20 MG-MCG PO TABS: 1.0000 | ORAL_TABLET | Freq: Every day | ORAL | 11 refills | Status: AC

## 2016-12-14 NOTE — Progress Notes (Signed)
Pre visit review using our clinic review tool, if applicable. No additional management support is needed unless otherwise documented below in the visit note. 

## 2016-12-14 NOTE — Progress Notes (Signed)
   Subjective:    Patient ID: Wendy StarksJasmine B Hutchinson, female    DOB: 1991/08/13, 26 y.o.   MRN: [id number]  HPI STD testing- pt declines sxs but 'i just want to check'.  Pt has no concerns about previous partners.  Typically using condoms.  Birth control- pt is interested in starting birth control.  She stopped her previous birth control meds due to hair growth.  [facility] and was told she was at high end of testosterone level and to check for PCOS.     Review of Systems For ROS see HPI     Objective:   Physical Exam  Constitutional: She is oriented to person, place, and time. She appears well-developed and well-nourished. No distress.  obese  HENT:  Head: Normocephalic and atraumatic.  Some facial hair under chin  Neurological: She is alert and oriented to person, place, and time.  Skin: Skin is warm and dry.  Psychiatric: She has a normal mood and affect. Her behavior is normal. Thought content normal.  Vitals reviewed.         Assessment & Plan:  PCOS- pt was told that she may have PCOS based on her 'high normal' testosterone level and hirsutism.  Refer to GYN for complete evaluation and ongoing management.  Pt expressed understanding and is in agreement w/ plan.   STD testing- pt states she has no concerns but desires testing today.  Stressed need for condom use even when on OCPs.  Pt expressed understanding and is in agreement w/ plan.   Birth control- pt is interested in resuming OCPs.  Based on her possible PCOS dx, UpToDate recommended 20 ethinyl estradiol and norethindrone.  Prescription sent to pharmacy along w/ instructions on appropriate start date, possible side effects, and appropriate use.  Pt expressed understanding and is in agreement w/ plan.

## 2016-12-14 NOTE — Patient Instructions (Signed)
Schedule your complete physical in 6 months We'll notify you of your lab results and make any changes if needed Start the new birth control the Sunday after you start your period (you may still be bleeding when you start) We'll call you with your GYN appt for the PCOS evaluation Call with any questions or concerns Have a great weekend!

## 2016-12-18 LAB — URINE CYTOLOGY ANCILLARY ONLY
Chlamydia: NEGATIVE
NEISSERIA GONORRHEA: NEGATIVE
Trichomonas: NEGATIVE

## 2016-12-24 ENCOUNTER — Encounter: Payer: Self-pay | Admitting: *Deleted

## 2017-07-27 ENCOUNTER — Other Ambulatory Visit: Payer: Self-pay | Admitting: Family Medicine

## 2017-09-16 ENCOUNTER — Other Ambulatory Visit: Payer: Self-pay | Admitting: Obstetrics and Gynecology

## 2017-09-16 DIAGNOSIS — N6310 Unspecified lump in the right breast, unspecified quadrant: Secondary | ICD-10-CM

## 2017-09-20 ENCOUNTER — Other Ambulatory Visit: Payer: Self-pay | Admitting: Obstetrics and Gynecology

## 2017-09-20 ENCOUNTER — Ambulatory Visit
Admission: RE | Admit: 2017-09-20 | Discharge: 2017-09-20 | Disposition: A | Discharge status: Home / Self Care | Payer: BLUE CROSS/BLUE SHIELD | Source: Ambulatory Visit | Attending: Obstetrics and Gynecology | Admitting: Obstetrics and Gynecology

## 2017-09-20 ENCOUNTER — Other Ambulatory Visit: Payer: BLUE CROSS/BLUE SHIELD

## 2017-09-20 DIAGNOSIS — N6310 Unspecified lump in the right breast, unspecified quadrant: Secondary | ICD-10-CM

## 2018-02-05 ENCOUNTER — Other Ambulatory Visit: Payer: Self-pay | Admitting: Obstetrics and Gynecology

## 2018-02-05 DIAGNOSIS — N6310 Unspecified lump in the right breast, unspecified quadrant: Secondary | ICD-10-CM

## 2018-04-04 ENCOUNTER — Other Ambulatory Visit: Payer: BLUE CROSS/BLUE SHIELD

## 2018-04-11 ENCOUNTER — Other Ambulatory Visit: Payer: Self-pay | Admitting: Obstetrics and Gynecology

## 2018-04-11 ENCOUNTER — Ambulatory Visit
Admission: RE | Admit: 2018-04-11 | Discharge: 2018-04-11 | Disposition: A | Discharge status: Home / Self Care | Payer: BLUE CROSS/BLUE SHIELD | Source: Ambulatory Visit | Attending: Obstetrics and Gynecology | Admitting: Obstetrics and Gynecology

## 2018-04-11 DIAGNOSIS — N6310 Unspecified lump in the right breast, unspecified quadrant: Secondary | ICD-10-CM

## 2018-04-11 DIAGNOSIS — N6489 Other specified disorders of breast: Secondary | ICD-10-CM

## 2018-09-17 LAB — HM PAP SMEAR

## 2018-09-17 LAB — HIV ANTIBODY (ROUTINE TESTING W REFLEX): HIV 1&2 Ab, 4th Generation: NEGATIVE

## 2018-09-26 ENCOUNTER — Encounter: Payer: Self-pay | Admitting: General Practice

## 2018-10-24 ENCOUNTER — Other Ambulatory Visit: Payer: BLUE CROSS/BLUE SHIELD

## 2019-08-21 ENCOUNTER — Encounter: Payer: BLUE CROSS/BLUE SHIELD | Admitting: Family Medicine

## 2019-10-14 ENCOUNTER — Encounter: Payer: BLUE CROSS/BLUE SHIELD | Admitting: Family Medicine

## 2021-05-17 ENCOUNTER — Encounter: Payer: Self-pay | Admitting: *Deleted
